# Patient Record
Sex: Male | Born: 1975 | Race: Black or African American | Hispanic: No | Marital: Single | Smoking: Former smoker
Health system: Southern US, Community
[De-identification: ages and names within clinical notes are randomized; demographics above are authoritative.]

## PROBLEM LIST (undated history)

## (undated) DIAGNOSIS — I10 Essential (primary) hypertension: Secondary | ICD-10-CM

## (undated) HISTORY — PX: HERNIA REPAIR: SHX51

---

## 2020-01-02 ENCOUNTER — Emergency Department (HOSPITAL_COMMUNITY)

## 2020-01-02 ENCOUNTER — Encounter (HOSPITAL_COMMUNITY): Payer: Self-pay | Admitting: *Deleted

## 2020-01-02 ENCOUNTER — Emergency Department (HOSPITAL_COMMUNITY)
Admission: EM | Admit: 2020-01-02 | Discharge: 2020-01-02 | Disposition: A | Discharge status: Home / Self Care | Attending: Emergency Medicine | Admitting: Emergency Medicine

## 2020-01-02 DIAGNOSIS — Y93B9 Activity, other involving muscle strengthening exercises: Secondary | ICD-10-CM | POA: Insufficient documentation

## 2020-01-02 DIAGNOSIS — I10 Essential (primary) hypertension: Secondary | ICD-10-CM | POA: Diagnosis not present

## 2020-01-02 DIAGNOSIS — S8991XA Unspecified injury of right lower leg, initial encounter: Secondary | ICD-10-CM | POA: Insufficient documentation

## 2020-01-02 DIAGNOSIS — W098XXA Fall on or from other playground equipment, initial encounter: Secondary | ICD-10-CM | POA: Diagnosis not present

## 2020-01-02 DIAGNOSIS — Z87891 Personal history of nicotine dependence: Secondary | ICD-10-CM | POA: Insufficient documentation

## 2020-01-02 DIAGNOSIS — Y998 Other external cause status: Secondary | ICD-10-CM | POA: Insufficient documentation

## 2020-01-02 DIAGNOSIS — S8992XA Unspecified injury of left lower leg, initial encounter: Secondary | ICD-10-CM | POA: Diagnosis present

## 2020-01-02 DIAGNOSIS — Y9289 Other specified places as the place of occurrence of the external cause: Secondary | ICD-10-CM | POA: Diagnosis not present

## 2020-01-02 HISTORY — DX: Essential (primary) hypertension: I10

## 2020-01-02 NOTE — Discharge Instructions (Addendum)
You were seen in the emergency department today for evaluation of right lower leg swelling.  Your ultrasound did not show signs of a blood clot your x-ray did not show any fractures.  Please try to keep the area elevated, apply ice 20 minutes on 40 minutes off as needed for swelling or pain.  You may apply an Ace wrap to help with compression.  Please follow-up with orthopedics in 1 week for reevaluation.  Return to the ER for new or worsening symptoms including but not limited to worsening pain, increased swelling, redness, fever, numbness, weakness, or any other concerns.

## 2020-01-02 NOTE — ED Provider Notes (Signed)
[facility] EMERGENCY DEPARTMENT Provider Note   CSN: [id number] Arrival date & time: 01/02/20  1453     History Chief Complaint  Patient presents with  . Leg Pain    Alexander Gordon is a 44 y.o. male with history of hypertension who presents to the emergency department via police for evaluation of left lower leg swelling status post injury 10 days prior.  Patient states he was doing box jumps [employer] when he fell injuring the left anterior lower leg.  He states since then he has developed gradually worsening swelling to the left lower leg more so in the ankle.  He is not having much pain except for to the area where he injured himself which is the proximal anterior lower leg.  No alleviating aggravating factors.  No intervention prior to arrival.  Tetanus is up-to-date within the past 5 years.  Denies weakness, numbness, color change, calf pain, dyspnea, chest pain, hemoptysis, recent surgery/trauma, recent long travel, hormone use, personal hx of cancer, or hx of DVT/PE. Sent from jail to r/o blood clot.      HPI     Past Medical History:  Diagnosis Date  . Hypertension     There are no problems to display for this patient.   Past Surgical History:  Procedure Laterality Date  . HERNIA REPAIR         No family history on file.  Social History   Tobacco Use  . Smoking status: Former Games developer  . Smokeless tobacco: Never Used  Substance Use Topics  . Alcohol use: Not Currently  . Drug use: Not Currently    Home Medications Prior to Admission medications   Not on File    Allergies    Patient has no known allergies.  Review of Systems   Review of Systems  Constitutional: Negative for chills and fever.  Respiratory: Negative for shortness of breath.        Negative for hemoptysis.  Cardiovascular: Positive for leg swelling. Negative for chest pain.  Gastrointestinal: Negative for nausea and vomiting.  Musculoskeletal: Positive for arthralgias.  Skin: Positive  for wound. Negative for color change and rash.  Neurological: Negative for weakness and numbness.    Physical Exam Updated Vital Signs BP (!) 141/95 (BP Location: Right Arm)   Pulse 64   Temp 98.2 F (36.8 C) (Oral)   Resp 14   Ht 5\' 10"  (1.778 m)   Wt 81.6 kg   SpO2 99%   BMI 25.83 kg/m   Physical Exam Vitals and nursing note reviewed.  Constitutional:      General: He is not in acute distress.    Appearance: He is not ill-appearing or toxic-appearing.  HENT:     Head: Normocephalic and atraumatic.  Cardiovascular:     Rate and Rhythm: Normal rate.     Pulses:          Dorsalis pedis pulses are 2+ on the right side and 2+ on the left side.       Posterior tibial pulses are 2+ on the right side and 2+ on the left side.  Pulmonary:     Effort: Pulmonary effort is normal.  Musculoskeletal:     Comments: Lower extremities: there is a scabbed over abrasion to the left anterior proximal tibia with surrounding soft tissue swelling. No purulent drainage or erythema/warmth. There is some swelling noted to the L ankle region. No significant pitting edema. Abrasion/swollen area anteriorly is tender to palpation, LEs are otherwise nontender. No  calf tenderness.   Skin:    General: Skin is warm and dry.     Capillary Refill: Capillary refill takes less than 2 seconds.  Neurological:     Mental Status: He is alert.     Comments: Alert. Clear speech. Sensation grossly intact to bilateral lower extremities. 5/5 strength with plantar/dorsiflexion bilaterally. Patient ambulatory.   Psychiatric:        Mood and Affect: Mood normal.        Behavior: Behavior normal.     ED Results / Procedures / Treatments   Labs (all labs ordered are listed, but only abnormal results are displayed) Labs Reviewed - No data to display  EKG None  Radiology DG Tibia/Fibula Left  Result Date: 01/02/2020 CLINICAL DATA:  Left foot and knee pain after exercising, [facility] from [facility]. EXAM: LEFT TIBIA AND  FIBULA - 2 VIEW COMPARISON:  None. FINDINGS: There is no evidence of fracture or other focal bone lesions. Soft tissues are unremarkable. IMPRESSION: Negative radiographs of the left tibia and fibula. Electronically Signed   By: Emmaline Kluver M.D.   On: 01/02/2020 17:04   US Venous Img Lower  Left (DVT Study)  Result Date: 01/02/2020 CLINICAL DATA:  Left leg swelling for 3 days. EXAM: LEFT LOWER EXTREMITY VENOUS DOPPLER ULTRASOUND TECHNIQUE: Gray-scale sonography with compression, as well as color and duplex ultrasound, were performed to evaluate the deep venous system(s) from the level of the common femoral vein through the popliteal and proximal calf veins. COMPARISON:  None. FINDINGS: VENOUS Normal compressibility of the common femoral, superficial femoral, and popliteal veins, as well as the visualized calf veins. Visualized portions of profunda femoral vein and great saphenous vein unremarkable. No filling defects to suggest DVT on grayscale or color Doppler imaging. Doppler waveforms show normal direction of venous flow, normal respiratory plasticity and response to augmentation. Limited views of the contralateral common femoral vein are unremarkable. OTHER Edema is noted in the calf. Limitations: none IMPRESSION: No evidence of DVT in the left lower extremity. Electronically Signed   By: Emmaline Kluver M.D.   On: 01/02/2020 17:03    Procedures Procedures (including critical care time)  Medications Ordered in ED Medications - No data to display  ED Course  I have reviewed the triage vital signs and the nursing notes.  Pertinent labs & imaging results that were available during my care of the patient were reviewed by me and considered in my medical decision making (see chart for details).    MDM Rules/Calculators/A&P                         Patient presents to the ED with complaints of left lower leg swelling S/p injury 10 days prior. On exam there is an abrasion with swelling to the  proximal anterior tibia with mild tenderness, no signs of infection to this area, mild L ankle swelling noted, no pitting edema, calf is nontender, soft compartments, NVI distally. Symmetric pulses & temperature to LEs- does not seem consistent with arterial thrombosis. Exam not consistent w/ cellulitis, abscess, or septic joint- no fever, erythema or warmth. Will obtain x-ray of tib/fib & LE venous duplex.   Additional history obtained:  Additional history obtained from guard @ bedside who confirms injury about 10 days ago and being sent to evaluate for blood clot by facility medical staff.   Imaging Studies ordered:  I ordered imaging studies which included x-ray of the tib/fib & venous duplex, I independently  visualized and interpreted imaging which showed no fracture, dislocation or DVT.   Re-assuring imaging.  Patient overall appears appropriate for discharge at this time. Recommended elevation& compression. I discussed results, treatment plan, need for follow-up, and return precautions with the patient. Provided opportunity for questions, patient confirmed understanding and is in agreement with plan.    Portions of this note were generated with Scientist, clinical (histocompatibility and immunogenetics). Dictation errors may occur despite best attempts at proofreading.  Final Clinical Impression(s) / ED Diagnoses Final diagnoses:  Injury of right lower extremity, initial encounter    Rx / DC Orders ED Discharge Orders    None       Desmond Lope 01/02/20 1750    Raeford Razor, MD 01/07/20 2047

## 2020-01-02 NOTE — ED Triage Notes (Signed)
Left foot and knee pain after exercising

## 2021-08-24 IMAGING — DX DG TIBIA/FIBULA 2V*L*
4 series · 4 of 4 positions shown · non-contrast
Comparison: None.

CLINICAL DATA: Left foot and knee pain after exercising, pt from
[place].

EXAM:
LEFT TIBIA AND FIBULA - 2 VIEW

[tibia ap (1 of 2)]
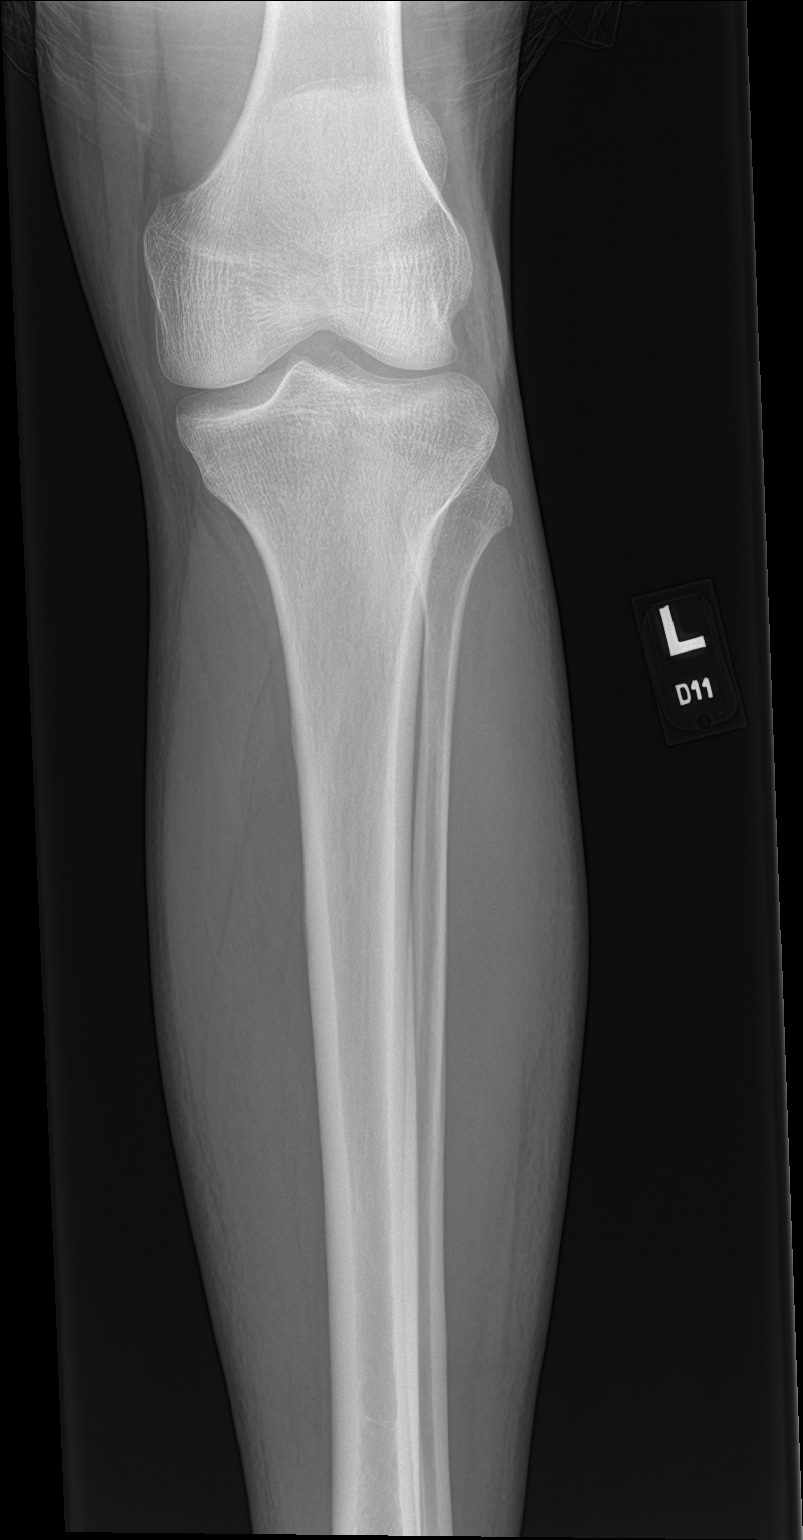

[tibia ap (2 of 2)]
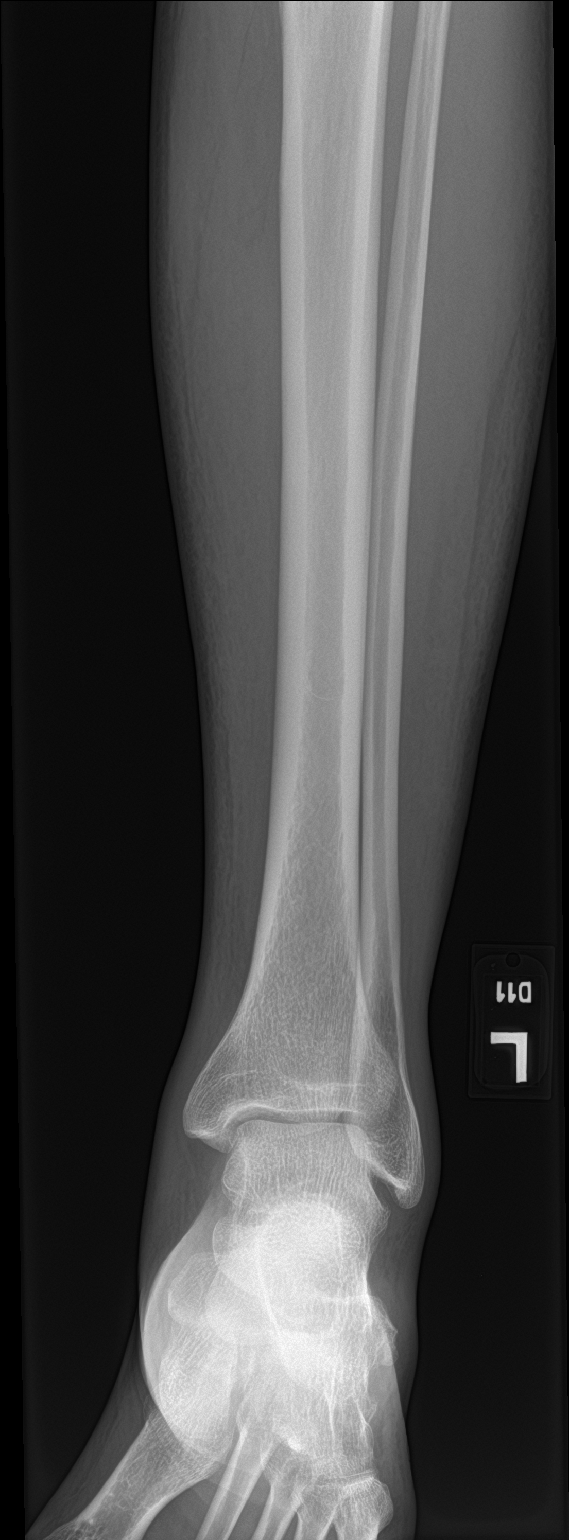

[tibia lat (1 of 2)]
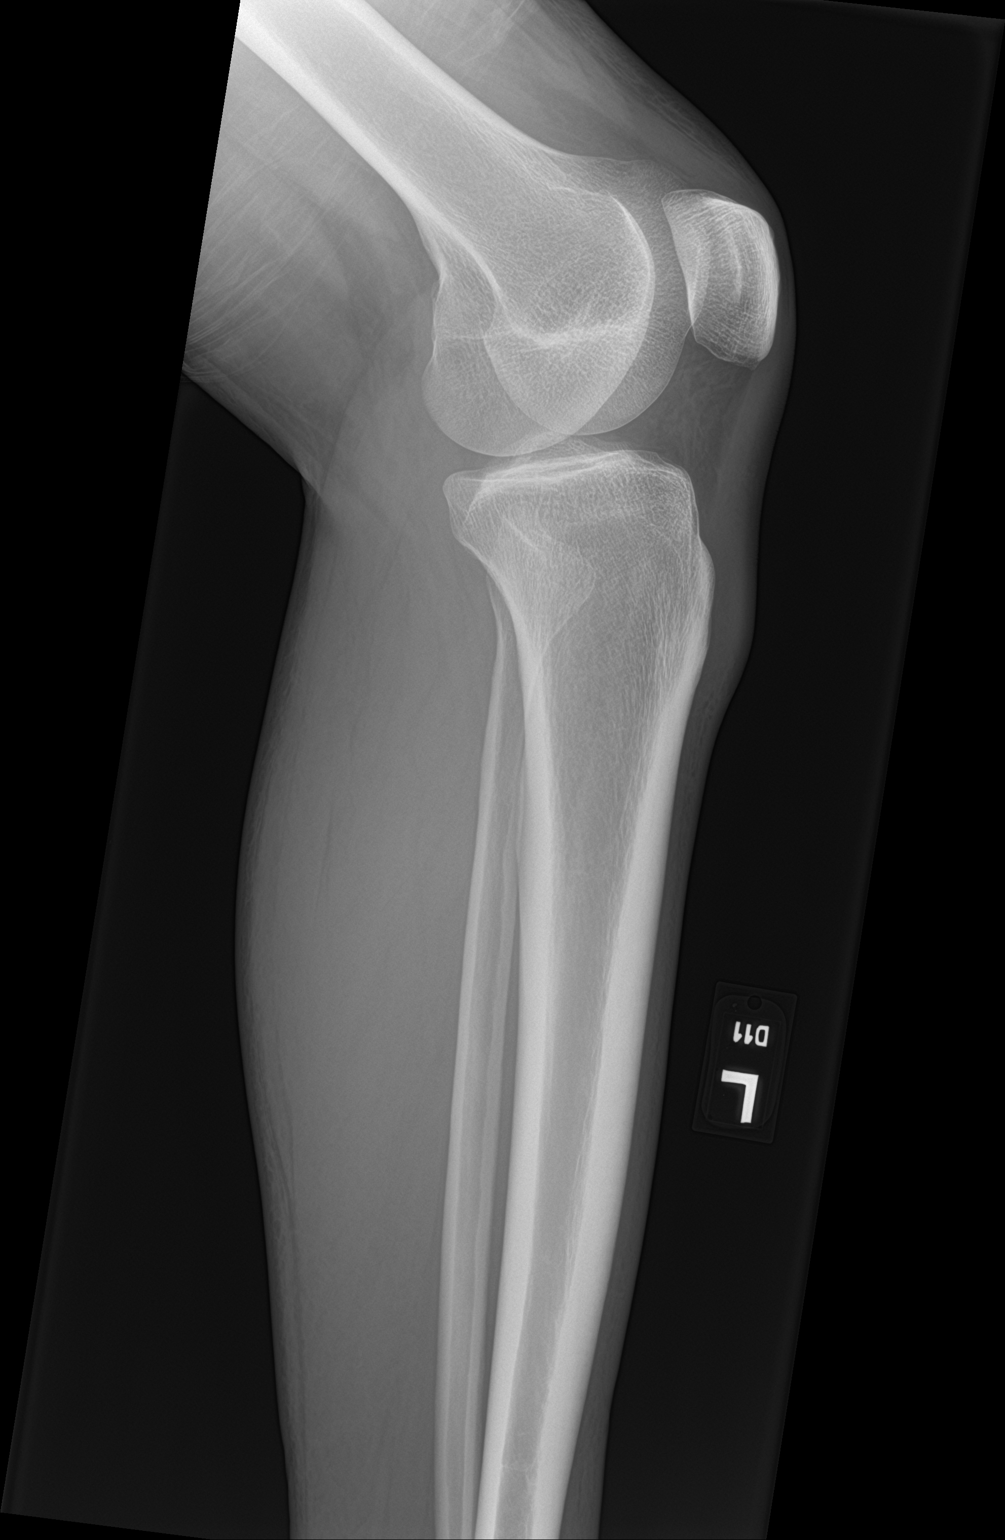

[tibia lat (2 of 2)]
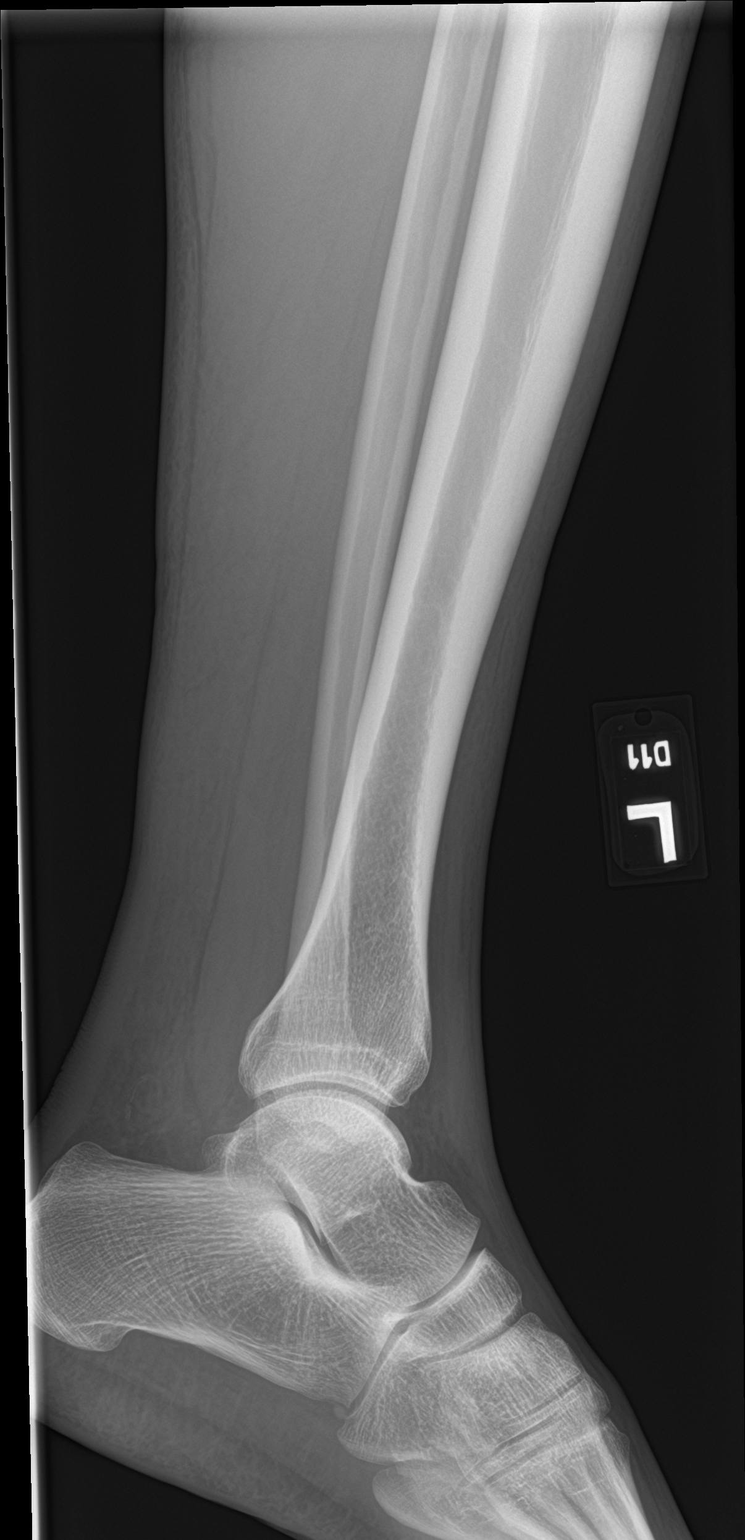

[4 of 4 positions shown; findings below may reference images not displayed]

FINDINGS: There is no evidence of fracture or other focal bone lesions. Soft
tissues are unremarkable.
IMPRESSION: Negative radiographs of the left tibia and fibula.
